# Patient Record
Sex: Female | Born: 2000 | Race: White | Hispanic: No | Marital: Single | Smoking: Never smoker
Health system: Southern US, Community
[De-identification: ages and names within clinical notes are randomized; demographics above are authoritative.]

## PROBLEM LIST (undated history)

## (undated) DIAGNOSIS — J45909 Unspecified asthma, uncomplicated: Secondary | ICD-10-CM

## (undated) DIAGNOSIS — K219 Gastro-esophageal reflux disease without esophagitis: Secondary | ICD-10-CM

## (undated) DIAGNOSIS — R519 Headache, unspecified: Secondary | ICD-10-CM

## (undated) DIAGNOSIS — F419 Anxiety disorder, unspecified: Secondary | ICD-10-CM

## (undated) DIAGNOSIS — R51 Headache: Secondary | ICD-10-CM

## (undated) HISTORY — DX: Anxiety disorder, unspecified: F41.9

## (undated) HISTORY — DX: Headache, unspecified: R51.9

## (undated) HISTORY — DX: Unspecified asthma, uncomplicated: J45.909

## (undated) HISTORY — DX: Headache: R51

## (undated) HISTORY — DX: Gastro-esophageal reflux disease without esophagitis: K21.9

---
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
## 2020-04-09 ENCOUNTER — Telehealth: Payer: Self-pay | Admitting: Pediatrics

## 2020-04-09 DIAGNOSIS — J4520 Mild intermittent asthma, uncomplicated: Secondary | ICD-10-CM

## 2020-04-09 MED ORDER — ALBUTEROL SULFATE HFA 108 (90 BASE) MCG/ACT IN AERS: 2.0000 | INHALATION_SPRAY | RESPIRATORY_TRACT | 1 refills | Status: DC | PRN

## 2020-04-09 NOTE — Telephone Encounter (Signed)
Medication refill sent to pharmacy  

## 2020-04-09 NOTE — Telephone Encounter (Signed)
Mom called, she needs a refill on child's albuterol inhaler sent to [facility]

## 2020-04-13 ENCOUNTER — Other Ambulatory Visit: Payer: Self-pay | Admitting: Pediatrics

## 2020-04-13 DIAGNOSIS — J4520 Mild intermittent asthma, uncomplicated: Secondary | ICD-10-CM

## 2020-04-14 ENCOUNTER — Telehealth: Payer: Self-pay | Admitting: Pediatrics

## 2020-04-14 DIAGNOSIS — J4520 Mild intermittent asthma, uncomplicated: Secondary | ICD-10-CM

## 2020-04-14 MED ORDER — ALBUTEROL SULFATE HFA 108 (90 BASE) MCG/ACT IN AERS: 2.0000 | INHALATION_SPRAY | RESPIRATORY_TRACT | 5 refills | Status: DC | PRN

## 2020-04-14 NOTE — Telephone Encounter (Signed)
Grandma called, she said the inhaler that was sent to the pharmacy is not covered by medicaid. She would like the pro-air sent instead.

## 2020-04-14 NOTE — Telephone Encounter (Signed)
Sent to [facility] pharmacy

## 2020-05-07 ENCOUNTER — Encounter: Payer: Self-pay | Admitting: Pediatrics

## 2020-05-07 ENCOUNTER — Other Ambulatory Visit: Payer: Self-pay

## 2020-05-07 ENCOUNTER — Ambulatory Visit (INDEPENDENT_AMBULATORY_CARE_PROVIDER_SITE_OTHER): Payer: Medicaid Other | Admitting: Pediatrics

## 2020-05-07 VITALS — BP 119/80 | HR 92 | Ht 63.5 in | Wt 190.2 lb

## 2020-05-07 DIAGNOSIS — K2100 Gastro-esophageal reflux disease with esophagitis, without bleeding: Secondary | ICD-10-CM | POA: Diagnosis not present

## 2020-05-07 DIAGNOSIS — E559 Vitamin D deficiency, unspecified: Secondary | ICD-10-CM

## 2020-05-07 DIAGNOSIS — R1319 Other dysphagia: Secondary | ICD-10-CM

## 2020-05-07 DIAGNOSIS — E781 Pure hyperglyceridemia: Secondary | ICD-10-CM | POA: Diagnosis not present

## 2020-05-07 NOTE — Progress Notes (Signed)
Patient was not accompanied. Interpreter:  none  SUBJECTIVE:  HPI: Veronica Crane is a 19 y.o. with mid sternal chest pain for 1 week.  When it started, it was just one big thump. She also felt a little faint. She walked to the bathroom to calm herself down, then she sat down.  She was not doing anything when it happened; she was just sitting.     Since then she has been feeling a squeezing feeling in her mid chest.  No palpitations. She does not have any problems breathing.  It is difficult to swallow, like she can really feel the bolus move down. Then she feels like the bolus gets stuck and she has to re-swallow.  When she drinks fluids, she has "loud gulps" which is a change.  She has a history of reflux.  She was started on Pantoprazole by Dr Janit Bern. The heartburn had gotten better, but now it seems to have come back. She is still taking Pantoprazole.    Also, she states that she has had RLQ pain intermittently.  She has seen Dr Darol Destine who had planned on doing an MRI which was not covered by insurance. Review of records reveal that he was entertaining Irritable Bowel Syndrome as a diagnosis.  She had sharp sensation radiating down her left arm yesterday at 1 am. She was already awake. She was laying on her left side.   When she lays down the feeling gets worse.           She breathes fast and hard after going up a flight of steps, but denies trouble getting her breath in or out.  Her asthma usually is triggered with exercise.  She was recently diagnosed with Vitamin D Deficiency.  She has been worried about that diagnosis.  She does take 5000 IU Vitamin D every day since February.     Review of Systems  Constitutional: Negative for activity change, appetite change, diaphoresis, fatigue and unexpected weight change.  HENT: Negative for drooling, facial swelling and mouth sores.   Eyes: Negative for visual disturbance.  Respiratory: Negative for cough and stridor.   Cardiovascular: Negative  for leg swelling.  Gastrointestinal: Negative for blood in stool.  Genitourinary: Negative for menstrual problem and pelvic pain.  Musculoskeletal: Negative for back pain, gait problem, neck pain and neck stiffness.  Skin: Negative for color change and rash.  Neurological: Negative for tremors and weakness.  Psychiatric/Behavioral: Negative for agitation, behavioral problems, self-injury and sleep disturbance.     Past Medical History:  Diagnosis Date  . Anxiety   . Asthma   . GERD (gastroesophageal reflux disease)   . Headache     No Known Allergies Outpatient Medications Prior to Visit  Medication Sig Dispense Refill  . albuterol (PROAIR HFA) 108 (90 Base) MCG/ACT inhaler Inhale 2 puffs into the lungs every 4 (four) hours as needed for wheezing or shortness of breath. 18 g 5  . Multiple Vitamin (MULTIVITAMIN WITH MINERALS) TABS tablet Take 1 tablet by mouth daily.    . pantoprazole (PROTONIX) 20 MG tablet Take 1 tablet (20 mg total) by mouth daily. 30 tablet 11  . albuterol (VENTOLIN HFA) 108 (90 Base) MCG/ACT inhaler INHALE 2 PUFFS BY MOUTH EVERY 4 HOURS AS NEEDED FOR WHEEZING FOR SHORTNESS OF BREATH 18 g 1   No facility-administered medications prior to visit.         OBJECTIVE: VITALS: BP 119/80   Pulse 92   Ht 5' 3.5" (1.613 m)  Wt 190 lb 3.2 oz (86.3 kg)   SpO2 97%   BMI 33.16 kg/m   Wt Readings from Last 3 Encounters:  05/07/20 190 lb 3.2 oz (86.3 kg) (97 %, Z= 1.81)*  10/29/19 175 lb 12.8 oz (79.7 kg) (94 %, Z= 1.59)*  08/13/19 176 lb (79.8 kg) (95 %, Z= 1.60)*   * Growth percentiles are based on CDC (Girls, 2-20 Years) data.     EXAM: General:  alert in no acute distress   Eyes: anicteric. PERRL. EOMI Mouth: nonerythematous tonsillar pillars, normal posterior pharyngeal wall, tongue midline, palate normal, no lesions, no bulging Neck:  supple. No thyromegaly. No cervical lymphadenopathy. Heart:  regular rate & rhythm.  No murmurs Lungs:  good air entry  bilaterally.  No adventitious sounds Abdomen: soft, non-distended, non-tender, no hepatosplenomegaly normal bowel sounds Skin: no rash Neurological: Non-focal. No facial asymmetry Extremities:  no clubbing/cyanosis/edema   ASSESSMENT/PLAN: 1. Other dysphagia I suspect the initial "thump" that she felt is a big air bubble.  Since then, she has been very conscious of her esophageal peristalsis over that area.  We will investigate it with an upper GI to make sure that there is no widening or narrowing in that area, especially with long-standing history of reflux.    - DG UGI W SINGLE CM (SOL OR THIN BA); Future - Ambulatory referral to Gastroenterology  Her exam and history are not consistent with a cardiac etiology.  2. Gastroesophageal reflux disease with esophagitis without hemorrhage - Ambulatory referral to Gastroenterology  She will also talk to him about the intermittent RLQ pain that she is experiencing.  With her vague symptoms, I wonder if she could have Crohn's disease.  She did have a normal ESR and CBC in the past.  Also, she stopped taking the Elavil because of how it made her feel. I told her that it is better that she let the prescriber know (I.e. Dr Darol Destine) so that he could make adjustments or changes.    3. Vitamin D deficiency Will check a level.  For now continue taking Vit D. - VITAMIN D 25 Hydroxy (Vit-D Deficiency, Fractures)  4. Pure hypertriglyceridemia Reassured her that her initial elevation is only mild.  Nonetheless, it is good that she is concerned as that means she will continue to be compliant with lifestyle changes.   - Lipid panel - Hemoglobin A1c     Return for phone call with results of tests within 1 week.

## 2020-05-07 NOTE — Patient Instructions (Signed)
High Triglycerides Eating Plan Triglycerides are a type of fat in the blood. High levels of triglycerides can increase your risk of heart disease and stroke. If your triglyceride levels are high, choosing the right foods can help lower your triglycerides and keep your heart healthy. Work with your health care provider or a diet and nutrition specialist (dietitian) to develop an eating plan that is right for you. What are tips for following this plan? General guidelines   Lose weight, if you are overweight. For most people, losing 5-10 lbs (2-5 kg) helps lower triglyceride levels. A weight-loss plan may include. ? 30 minutes of exercise at least 5 days a week. ? Reducing the amount of calories, sugar, and fat you eat.  Eat a wide variety of fresh fruits, vegetables, and whole grains. These foods are high in fiber.  Eat foods that contain healthy fats, such as fatty fish, nuts, seeds, and olive oil.  Avoid foods that are high in added sugar, added salt (sodium), saturated fat, and trans fat.  Avoid low-fiber, refined carbohydrates such as white bread, crackers, noodles, and white rice.  Avoid foods with partially hydrogenated oils (trans fats), such as fried foods or stick margarine.  Limit alcohol intake to no more than 1 drink a day for nonpregnant women and 2 drinks a day for men. One drink equals 12 oz of beer, 5 oz of wine, or 1 oz of hard liquor. Your health care provider may recommend that you drink less depending on your overall health. Reading food labels  Check food labels for the amount of saturated fat. Choose foods with no or very little saturated fat.  Check food labels for the amount of trans fat. Choose foods with no trans fat.  Check food labels for the amount of cholesterol. Choose foods low in cholesterol. Ask your dietitian how much cholesterol you should have each day.  Check food labels for the amount of sodium. Choose foods with less than 140 milligrams (mg) per  serving. Shopping  Buy dairy products labeled as nonfat (skim) or low-fat (1%).  Avoid buying processed or prepackaged foods. These are often high in added sugar, sodium, and fat. Cooking  Choose healthy fats when cooking, such as olive oil or canola oil.  Cook foods using lower fat methods, such as baking, broiling, boiling, or grilling.  Make your own sauces, dressings, and marinades when possible, instead of buying them. Store-bought sauces, dressings, and marinades are often high in sodium and sugar. Meal planning  Eat more home-cooked food and less restaurant, buffet, and fast food.  Eat fatty fish at least 2 times each week. Examples of fatty fish include salmon, trout, mackerel, tuna, and herring.  If you eat whole eggs, do not eat more than 3 egg yolks per week. What foods are recommended? The items listed may not be a complete list. Talk with your dietitian about what dietary choices are best for you. Grains Whole wheat or whole grain breads, crackers, cereals, and pasta. Unsweetened oatmeal. Bulgur. Barley. Quinoa. Brown rice. Whole wheat flour tortillas. Vegetables Fresh or frozen vegetables. Low-sodium canned vegetables. Fruits All fresh, canned (in natural juice), or frozen fruits. Meats and other protein foods Skinless chicken or turkey. Ground chicken or turkey. Lean cuts of pork, trimmed of fat. Fish and seafood, especially salmon, trout, and herring. Egg whites. Dried beans, peas, or lentils. Unsalted nuts or seeds. Unsalted canned beans. Natural peanut or almond butter. Dairy Low-fat dairy products. Skim or low-fat (1%) milk. Reduced fat (  2%) and low-sodium cheese. Low-fat ricotta cheese. Low-fat cottage cheese. Plain, low-fat yogurt. Fats and oils Tub margarine without trans fats. Light or reduced-fat mayonnaise. Light or reduced-fat salad dressings. Avocado. Safflower, olive, sunflower, soybean, and canola oils. What foods are not recommended? The items listed  may not be a complete list. Talk with your dietitian about what dietary choices are best for you. Grains White bread. White (regular) pasta. White rice. Cornbread. Bagels. Pastries. Crackers that contain trans fat. Vegetables Creamed or fried vegetables. Vegetables in a cheese sauce. Fruits Sweetened dried fruit. Canned fruit in syrup. Fruit juice. Meats and other protein foods Fatty cuts of meat. Ribs. Chicken wings. Bacon. Sausage. Bologna. Salami. Chitterlings. Fatback. Hot dogs. Bratwurst. Packaged lunch meats. Dairy Whole or reduced-fat (2%) milk. Half-and-half. Cream cheese. Full-fat or sweetened yogurt. Full-fat cheese. Nondairy creamers. Whipped toppings. Processed cheese or cheese spreads. Cheese curds. Beverages Alcohol. Sweetened drinks, such as soda, lemonade, fruit drinks, or punches. Fats and oils Butter. Stick margarine. Lard. Shortening. Ghee. Bacon fat. Tropical oils, such as coconut, palm kernel, or palm oils. Sweets and desserts Corn syrup. Sugars. Honey. Molasses. Candy. Jam and jelly. Syrup. Sweetened cereals. Cookies. Pies. Cakes. Donuts. Muffins. Ice cream. Condiments Store-bought sauces, dressings, and marinades that are high in sugar, such as ketchup and barbecue sauce. Summary  High levels of triglycerides can increase the risk of heart disease and stroke. Choosing the right foods can help lower your triglycerides.  Eat plenty of fresh fruits, vegetables, and whole grains. Choose low-fat dairy and lean meats. Eat fatty fish at least twice a week.  Avoid processed and prepackaged foods with added sugar, sodium, saturated fat, and trans fat.  If you need suggestions or have questions about what types of food are good for you, talk with your health care provider or a dietitian. This information is not intended to replace advice given to you by your health care provider. Make sure you discuss any questions you have with your health care provider. Document Revised:  08/26/2017 Document Reviewed: 11/16/2016 Elsevier Patient Education  2020 [employer] Inc.  

## 2020-05-16 ENCOUNTER — Encounter: Payer: Self-pay | Admitting: Internal Medicine

## 2020-06-06 ENCOUNTER — Telehealth: Payer: Self-pay | Admitting: Pediatrics

## 2020-06-06 NOTE — Telephone Encounter (Signed)
I called the patient to see if anyone from [facility] has scheduled her UGI and she said no. Could you arrange for this? Thanks!!

## 2020-06-09 NOTE — Telephone Encounter (Signed)
UGI has been scheduled for 06/12/20, pt to arrive at 11:15 am at [facility]; lvm with appt info on Faatimah's cell

## 2020-06-12 ENCOUNTER — Ambulatory Visit (HOSPITAL_COMMUNITY)
Admission: RE | Admit: 2020-06-12 | Discharge: 2020-06-12 | Disposition: A | Discharge status: Home / Self Care | Payer: Medicaid Other | Source: Ambulatory Visit | Attending: Pediatrics | Admitting: Pediatrics

## 2020-06-12 ENCOUNTER — Other Ambulatory Visit: Payer: Self-pay | Admitting: Pediatrics

## 2020-06-12 ENCOUNTER — Other Ambulatory Visit: Payer: Self-pay

## 2020-06-12 DIAGNOSIS — R1319 Other dysphagia: Secondary | ICD-10-CM

## 2020-06-13 NOTE — Progress Notes (Signed)
Please tell Elisabel that her test showed reflux, no other abnormality. She can take a TUMS as needed.  She can get that over the counter.

## 2020-07-03 NOTE — Progress Notes (Signed)
Referring Provider: Johny Drilling, DO Primary Care Physician:  Vella Kohler, MD Primary Gastroenterologist:  Dr. Jena Gauss  Chief Complaint  Patient presents with  . Dysphagia    x 6-8 months  . Gastroesophageal Reflux    burning in chest/throat. Tried pantoprazole and did not help    HPI:   [facility]onica Crane is a 19 y.o. female presenting today at the request of Johny Drilling, DO for GERD and dysphagia.  Reviewed office visit 05/07/2020 with Dr. Mort Sawyers.  Patient was complaining of midsternal chest pain x1 week.  Reported difficulty swallowing, like she can feel the bolus move down and occasionally felt the bolus get stuck requiring reswallowing.  Has history of reflux and was taking pantoprazole.  Also experienced sharp sensation radiating down her left arm at 1 AM the day prior to office visit.  Stated when laying down the feeling gets worse.  Also reported intermittent RLQ abdominal pain.  Abdominal exam was benign.  Recommended upper GI series and referral to GI.  UGI 06/12/2020 with GERD, otherwise normal.  Today:   GERD: 2+ year history of GERD. Burning in her chest/throat daily. Occasional nausea. No vomiting. Tried Protonix 20mg  helped for about 1 year but started to wear off and stopped about 1 month ago. When laying down, reflux symptom are the worst. Will take tums at night. Eating fast foods 2-3 times a week. No soda. No carbonated beverages. Occasionally eats within 3 hours of laying down at night.   Ibuprofen for menstrual cramping a couple times a month.  Dysphagia: Present for 6-8 months. Feels like the foods get stuck. Requires a second swallow and foods go down.  Occasional food regurgitation.   No brbpr or melena.  BMs typically daily. Occasional constipation. Had some mucous in her stools and had some diarrhea when she was on Protonix. This has essentially resolved.  No abdominal pain.  Reports she had experienced lower abdominal pain previously, but this seemed  to resolve itself.   Past Medical History:  Diagnosis Date  . Anxiety   . Asthma   . GERD (gastroesophageal reflux disease)   . Headache     Past Surgical History:  Procedure Laterality Date  . SCALP LACERATION REPAIR N/A 03/02/2017   Procedure: CLOSURE SCALP LACERATION;  Surgeon: 05/02/2017, MD;  Location: [facility] OR;  Service: General;  Laterality: N/A;  . wisdom teeth removal  2021    Current Outpatient Medications  Medication Sig Dispense Refill  . albuterol (PROAIR HFA) 108 (90 Base) MCG/ACT inhaler Inhale 2 puffs into the lungs every 4 (four) hours as needed for wheezing or shortness of breath. 18 g 5  . cholecalciferol (VITAMIN D3) 25 MCG (1000 UNIT) tablet Take 1,000 Units by mouth daily.    . Multiple Vitamin (MULTIVITAMIN WITH MINERALS) TABS tablet Take 1 tablet by mouth daily.    . SUMAtriptan (IMITREX) 50 MG tablet Take 50 mg at onset of migraine. May repeat in 1 hour if not gone.    2022 esomeprazole (NEXIUM) 40 MG capsule Take 1 capsule (40 mg total) by mouth daily before breakfast. 30 capsule 5   No current facility-administered medications for this visit.    Allergies as of 07/04/2020  . (No Known Allergies)    Family History  Problem Relation Age of Onset  . Asthma Mother   . Asthma Maternal Grandmother   . Immunodeficiency Maternal Grandfather   . Immunodeficiency Paternal Grandfather   . Irritable bowel syndrome Neg Hx   .  Crohn's disease Neg Hx   . Celiac disease Neg Hx   . Colon cancer Neg Hx     Social History   Socioeconomic History  . Marital status: Single    Spouse name: Not on file  . Number of children: Not on file  . Years of education: Not on file  . Highest education level: Not on file  Occupational History  . Not on file  Tobacco Use  . Smoking status: Never Smoker  . Smokeless tobacco: Never Used  Substance and Sexual Activity  . Alcohol use: Never  . Drug use: Never  . Sexual activity: Yes    Birth control/protection: None    Other Topics Concern  . Not on file  Social History Narrative      Lives at    She lives with her mom only. She has one brother.   She enjoys working out, listening to music, and shopping.   Social Determinants of Health   Financial Resource Strain:   . Difficulty of Paying Living Expenses: Not on file  Food Insecurity:   . Worried About Programme researcher, broadcasting/film/video in the Last Year: Not on file  . Ran Out of Food in the Last Year: Not on file  Transportation Needs:   . Lack of Transportation (Medical): Not on file  . Lack of Transportation (Non-Medical): Not on file  Physical Activity:   . Days of Exercise per Week: Not on file  . Minutes of Exercise per Session: Not on file  Stress:   . Feeling of Stress : Not on file  Social Connections:   . Frequency of Communication with Friends and Family: Not on file  . Frequency of Social Gatherings with Friends and Family: Not on file  . Attends Religious Services: Not on file  . Active Member of Clubs or Organizations: Not on file  . Attends Banker Meetings: Not on file  . Marital Status: Not on file  Intimate Partner Violence:   . Fear of Current or Ex-Partner: Not on file  . Emotionally Abused: Not on file  . Physically Abused: Not on file  . Sexually Abused: Not on file    Review of Systems: Gen: Denies any fever, chills, cold or flulike symptoms, presyncope, syncope.  Admits to occasional lightheadedness. CV: Denies chest pain or heart palpitations. Resp: Denies shortness of breath or cough.  GI: See HPI. GU : Denies urinary burning, urinary frequency, urinary hesitancy MS: Denies joint pain Derm: Denies rash Psych: Admits to anxiety. Heme: See HPI  Physical Exam: BP 120/81   Pulse 72   Temp (!) 97.3 F (36.3 C) (Oral)   Ht 5\' 4"  (1.626 m)   Wt 186 lb 3.2 oz (84.5 kg)   LMP 06/05/2020 (Approximate)   BMI 31.96 kg/m  General:   Alert and oriented. Pleasant and cooperative. Well-nourished and  well-developed.  Head:  Normocephalic and atraumatic. Eyes:  Without icterus, sclera clear and conjunctiva pink.  Ears:  Normal auditory acuity. Lungs:  Clear to auscultation bilaterally. No wheezes, rales, or rhonchi. No distress.  Heart:  S1, S2 present without murmurs appreciated.  Abdomen:  +BS, soft, non-tender and non-distended. No HSM noted. No guarding or rebound. No masses appreciated.  Rectal:  Deferred  Msk:  Symmetrical without gross deformities. Normal posture. Extremities:  Without edema. Neurologic:  Alert and  oriented x4;  grossly normal neurologically. Skin:  Intact without significant lesions or rashes. Psych: Normal mood and affect.

## 2020-07-04 ENCOUNTER — Encounter: Payer: Self-pay | Admitting: Gastroenterology

## 2020-07-04 ENCOUNTER — Ambulatory Visit (INDEPENDENT_AMBULATORY_CARE_PROVIDER_SITE_OTHER): Payer: Medicaid Other | Admitting: Gastroenterology

## 2020-07-04 ENCOUNTER — Telehealth: Payer: Self-pay | Admitting: *Deleted

## 2020-07-04 ENCOUNTER — Other Ambulatory Visit: Payer: Self-pay

## 2020-07-04 DIAGNOSIS — R1310 Dysphagia, unspecified: Secondary | ICD-10-CM

## 2020-07-04 DIAGNOSIS — K219 Gastro-esophageal reflux disease without esophagitis: Secondary | ICD-10-CM

## 2020-07-04 MED ORDER — ESOMEPRAZOLE MAGNESIUM 40 MG PO CPDR: 40.0000 mg | DELAYED_RELEASE_CAPSULE | Freq: Every day | ORAL | 5 refills | Status: DC

## 2020-07-04 NOTE — Telephone Encounter (Signed)
Called pt. She has been scheduled for EGD +/-DIL with propofol with Dr. Jena Gauss, asa 2, on 10/21 at 12:45pm. Aware will need covid test prior. Advised will send her prep instructions and her covid test appt to her [facility]. She voiced understanding.

## 2020-07-04 NOTE — Patient Instructions (Addendum)
We will be scheduled for an upper endoscopy with possible dilation of your esophagus in the near future with Dr. Jena Gauss.  For now, I recommend you eat slowly, take small bites, chew thoroughly, and drink plenty of liquids throughout your meals. All meats should be ground finely. If something were to get hung in your esophagus and not come up or go down, you should proceed to the emergency room.  Please start Nexium 40 mg daily 30 minutes before breakfast.  Please follow a GERD diet/lifestyle: Avoid fried, fatty, greasy, spicy, citrus foods. Avoid caffeine and carbonated beverages. Avoid chocolate. Try eating 4-6 small meals a day rather than 3 large meals. Do not eat within 3 hours of laying down. Prop head of bed up on wood or bricks to create a 6 inch incline.  We will see back after your procedure.  Do not hesitate to call if you have questions or concerns prior.  It was great meeting you today!  Ermalinda Memos, PA-C [facility]   Food Choices for Gastroesophageal Reflux Disease, Adult When you have gastroesophageal reflux disease (GERD), the foods you eat and your eating habits are very important. Choosing the right foods can help ease your discomfort. Think about working with a nutrition specialist (dietitian) to help you make good choices. What are tips for following this plan?  Meals  Choose healthy foods that are low in fat, such as fruits, vegetables, whole grains, low-fat dairy products, and lean meat, fish, and poultry.  Eat small meals often instead of 3 large meals a day. Eat your meals slowly, and in a place where you are relaxed. Avoid bending over or lying down until 2-3 hours after eating.  Avoid eating meals 2-3 hours before bed.  Avoid drinking a lot of liquid with meals.  Cook foods using methods other than frying. Bake, grill, or broil food instead.  Avoid or limit: ? Chocolate. ? Peppermint or spearmint. ? Alcohol. ? Pepper. ? Black  and decaffeinated coffee. ? Black and decaffeinated tea. ? Bubbly (carbonated) soft drinks. ? Caffeinated energy drinks and soft drinks.  Limit high-fat foods such as: ? Fatty meat or fried foods. ? Whole milk, cream, butter, or ice cream. ? Nuts and nut butters. ? Pastries, donuts, and sweets made with butter or shortening.  Avoid foods that cause symptoms. These foods may be different for everyone. Common foods that cause symptoms include: ? Tomatoes. ? Oranges, lemons, and limes. ? Peppers. ? Spicy food. ? Onions and garlic. ? Vinegar. Lifestyle  Maintain a healthy weight. Ask your doctor what weight is healthy for you. If you need to lose weight, work with your doctor to do so safely.  Exercise for at least 30 minutes for 5 or more days each week, or as told by your doctor.  Wear loose-fitting clothes.  Do not smoke. If you need help quitting, ask your doctor.  Sleep with the head of your bed higher than your feet. Use a wedge under the mattress or blocks under the bed frame to raise the head of the bed. Summary  When you have gastroesophageal reflux disease (GERD), food and lifestyle choices are very important in easing your symptoms.  Eat small meals often instead of 3 large meals a day. Eat your meals slowly, and in a place where you are relaxed.  Limit high-fat foods such as fatty meat or fried foods.  Avoid bending over or lying down until 2-3 hours after eating.  Avoid peppermint and spearmint, caffeine, alcohol,  and chocolate. This information is not intended to replace advice given to you by your health care provider. Make sure you discuss any questions you have with your health care provider. Document Revised: 01/04/2019 Document Reviewed: 10/19/2016 Elsevier Patient Education  2020 [employer].

## 2020-07-04 NOTE — Assessment & Plan Note (Signed)
19 year old female with 2+ year history of GERD.  Not currently on a PPI.  Had been on Protonix for about 1 year which began to lose effect so she stopped the medication about 1 month ago.  Also reports Protonix caused diarrhea which has now resolved.  Currently with reflux symptoms daily that are worse at night.  Also reporting 6-8 months of dysphagia.  Suspect GERD symptoms are likely influenced by dietary habits.  Plan: Start Nexium 40 mg daily 30 minutes before breakfast. Counseled on the importance of dietary changes and following a "GERD diet/lifestyle".  Information was provided on this. Proceed with EGD +/- dilation with propofol with Dr. Jena Gauss in the near future. The risks, benefits, and alternatives have been discussed with the patient in detail. The patient states understanding and desires to proceed.  We will plan to see her back in the office after procedure.  Hopefully we will be able to wean down PPI in the future.

## 2020-07-04 NOTE — Assessment & Plan Note (Addendum)
6-8 months of dysphagia with sensation of foods getting stuck in her esophagus requiring a "second swallow".  Occasional food regurgitation.  Also with 2+ year history of GERD that is not adequately controlled as discussed above.  Suspect uncontrolled GERD has likely caused some esophagitis and possible esophageal web, ring, or stricture.  Plan: Proceed with EGD +/-dilation with propofol with Dr. Jena Gauss in the near future. The risks, benefits, and alternatives have been discussed with the patient in detail. The patient states understanding and desires to proceed.  ASA II Eat slowly, take small bites, chew thoroughly, and drink plenty of liquids throughout meals. All meats should be ground finely. Advised if something were to get hung in her esophagus and not come up or go down, she should proceed to the emergency room.  Start Nexium 40 mg daily 30 minutes before breakfast. Follow-up after procedure.

## 2020-07-16 ENCOUNTER — Other Ambulatory Visit: Payer: Self-pay

## 2020-07-16 ENCOUNTER — Other Ambulatory Visit (HOSPITAL_COMMUNITY)
Admission: RE | Admit: 2020-07-16 | Discharge: 2020-07-16 | Disposition: A | Discharge status: Home / Self Care | Payer: Medicaid Other | Source: Ambulatory Visit | Attending: Internal Medicine | Admitting: Internal Medicine

## 2020-07-16 ENCOUNTER — Encounter (HOSPITAL_COMMUNITY): Payer: Self-pay | Admitting: Anesthesiology

## 2020-07-16 DIAGNOSIS — Z01812 Encounter for preprocedural laboratory examination: Secondary | ICD-10-CM | POA: Insufficient documentation

## 2020-07-16 DIAGNOSIS — Z20822 Contact with and (suspected) exposure to covid-19: Secondary | ICD-10-CM | POA: Diagnosis not present

## 2020-07-16 NOTE — Progress Notes (Signed)
Had to leave patient a message with her voicemail. Her procedure is tomorrow. I explained to her if she could not come today before 4:00, she would need to call her Dr.'s office and have them reschedule her procedure. Waiting for patient to call back. Spoke with pt. Said she would be here between 3:00 and 3:30. Made registration aware. Nothing further needed.

## 2020-07-17 ENCOUNTER — Encounter (HOSPITAL_COMMUNITY): Admission: RE | Payer: Self-pay | Source: Home / Self Care

## 2020-07-17 ENCOUNTER — Ambulatory Visit (HOSPITAL_COMMUNITY): Admission: RE | Admit: 2020-07-17 | Payer: Medicaid Other | Source: Home / Self Care | Admitting: Internal Medicine

## 2020-07-17 LAB — SARS CORONAVIRUS 2 (TAT 6-24 HRS): SARS Coronavirus 2: NEGATIVE

## 2020-07-17 SURGERY — ESOPHAGOGASTRODUODENOSCOPY (EGD) WITH PROPOFOL
Anesthesia: Monitor Anesthesia Care

## 2020-07-17 NOTE — OR Nursing (Signed)
Called patient to see if she could come in early. Patient stated she wanted to reschedule because she is "feeling under the weather."

## 2020-07-17 NOTE — Telephone Encounter (Signed)
I received a call from Schneider, Consulting civil engineer at  [facility] stating she spoke with the patient and she didn't feel well and would like to cancel her procedure for today.  She will call our office back to reschedule.

## 2020-08-07 ENCOUNTER — Ambulatory Visit (INDEPENDENT_AMBULATORY_CARE_PROVIDER_SITE_OTHER): Payer: Medicaid Other | Admitting: Pediatrics

## 2020-08-07 ENCOUNTER — Encounter: Payer: Self-pay | Admitting: Pediatrics

## 2020-08-07 ENCOUNTER — Other Ambulatory Visit: Payer: Self-pay

## 2020-08-07 VITALS — BP 132/84 | HR 101 | Ht 63.78 in | Wt 182.4 lb

## 2020-08-07 DIAGNOSIS — J029 Acute pharyngitis, unspecified: Secondary | ICD-10-CM

## 2020-08-07 DIAGNOSIS — J4521 Mild intermittent asthma with (acute) exacerbation: Secondary | ICD-10-CM

## 2020-08-07 DIAGNOSIS — R059 Cough, unspecified: Secondary | ICD-10-CM

## 2020-08-07 DIAGNOSIS — J069 Acute upper respiratory infection, unspecified: Secondary | ICD-10-CM | POA: Diagnosis not present

## 2020-08-07 DIAGNOSIS — Z20822 Contact with and (suspected) exposure to covid-19: Secondary | ICD-10-CM

## 2020-08-07 LAB — POC SOFIA SARS ANTIGEN FIA: SARS:: NEGATIVE

## 2020-08-07 LAB — POCT INFLUENZA B: Rapid Influenza B Ag: NEGATIVE

## 2020-08-07 LAB — POCT RAPID STREP A (OFFICE): Rapid Strep A Screen: NEGATIVE

## 2020-08-07 LAB — POCT INFLUENZA A: Rapid Influenza A Ag: NEGATIVE

## 2020-08-07 MED ORDER — ALBUTEROL SULFATE HFA 108 (90 BASE) MCG/ACT IN AERS: 2.0000 | INHALATION_SPRAY | RESPIRATORY_TRACT | 0 refills | Status: AC | PRN

## 2020-08-07 MED ORDER — PREDNISONE 20 MG PO TABS: 20.0000 mg | ORAL_TABLET | Freq: Two times a day (BID) | ORAL | 0 refills | Status: AC

## 2020-08-07 MED ORDER — MASK VORTEX/CHILD/FROG MISC: 1 refills | Status: AC

## 2020-08-07 NOTE — Progress Notes (Signed)
Name: Veronica Crane Age: 19 y.o. Sex: female DOB: [birth year] MRN: [id number] Date of office visit: 08/07/2020  Chief Complaint  Patient presents with  . Cough  . Nasal Congestion    The patient is here by herself    HPI:  This is a 19 y.o. old patient who presents with nasal congestion, cough, and sore throat which started about one week ago.  She states her cough is congested sounding.  She is coughing up green mucus.  The patient has a history of intermittent asthma.  She states she does not cough at night or with exercise when well.  Since she started coughing, she has been using her albuterol every 2-4 hours, however she does not use a spacer with her albuterol. She feels like the albuterol helps her cough.  She has had fever with T-max of 100.4 F. The patient denies vomiting, diarrhea, or abdominal pain.   Past Medical History:  Diagnosis Date  . Anxiety   . Asthma   . GERD (gastroesophageal reflux disease)   . Headache     Past Surgical History:  Procedure Laterality Date  . SCALP LACERATION REPAIR N/A 03/02/2017   Procedure: CLOSURE SCALP LACERATION;  Surgeon: Harriette Bouillon, MD;  Location: [facility] OR;  Service: General;  Laterality: N/A;  . wisdom teeth removal  2021     Family History  Problem Relation Age of Onset  . Asthma Mother   . Asthma Maternal Grandmother   . Immunodeficiency Maternal Grandfather   . Immunodeficiency Paternal Grandfather   . Irritable bowel syndrome Neg Hx   . Crohn's disease Neg Hx   . Celiac disease Neg Hx   . Colon cancer Neg Hx     Outpatient Encounter Medications as of 08/07/2020  Medication Sig  . albuterol (PROAIR HFA) 108 (90 Base) MCG/ACT inhaler Inhale 2 puffs into the lungs every 4 (four) hours as needed (for cough). USE WITH SPACER  . calcium carbonate (TUMS - DOSED IN MG ELEMENTAL CALCIUM) 500 MG chewable tablet Chew 1,500 mg by mouth daily as needed for indigestion or heartburn.  . Cholecalciferol (DIALYVITE VITAMIN D  5000) 125 MCG (5000 UT) capsule Take 5,000 Units by mouth daily.  Marland Kitchen esomeprazole (NEXIUM) 40 MG capsule Take 1 capsule (40 mg total) by mouth daily before breakfast.  . ibuprofen (ADVIL) 200 MG tablet Take 400-600 mg by mouth every 6 (six) hours as needed for headache or moderate pain.  . [DISCONTINUED] albuterol (PROAIR HFA) 108 (90 Base) MCG/ACT inhaler Inhale 2 puffs into the lungs every 4 (four) hours as needed for wheezing or shortness of breath.  . predniSONE (DELTASONE) 20 MG tablet Take 1 tablet (20 mg total) by mouth 2 (two) times daily with a meal for 5 days.  Marland Kitchen Spacer/Aero-Hold Chamber Mask (MASK VORTEX/CHILD/FROG) MISC Use as directed   No facility-administered encounter medications on file as of 08/07/2020.     ALLERGIES:  No Known Allergies   OBJECTIVE:  VITALS: Blood pressure 132/84, pulse (!) 101, height 5' 3.78" (1.62 m), weight 182 lb 6.4 oz (82.7 kg), SpO2 96 %.   Body mass index is 31.53 kg/m.  95 %ile (Z= 1.68) based on CDC (Girls, 2-20 Years) BMI-for-age based on BMI available as of 08/07/2020.  Wt Readings from Last 3 Encounters:  08/07/20 182 lb 6.4 oz (82.7 kg) (95 %, Z= 1.67)*  07/04/20 186 lb 3.2 oz (84.5 kg) (96 %, Z= 1.74)*  05/07/20 190 lb 3.2 oz (86.3 kg) (97 %, Z=  1.81)*   * Growth percentiles are based on CDC (Girls, 2-20 Years) data.   Ht Readings from Last 3 Encounters:  08/07/20 5' 3.78" (1.62 m) (42 %, Z= -0.20)*  07/04/20 5\' 4"  (1.626 m) (46 %, Z= -0.11)*  05/07/20 5' 3.5" (1.613 m) (38 %, Z= -0.30)*   * Growth percentiles are based on CDC (Girls, 2-20 Years) data.     PHYSICAL EXAM:  General: The patient appears awake, alert, and in no acute distress.  Head: Head is atraumatic/normocephalic.  Ears: Right TM is obscured by cerumen. Left TMs is translucent without erythema or bulging.  Eyes: No scleral icterus.  No conjunctival injection.  Nose: Nasal congestion is present with white nasal discharge.  Turbinates are  injected.  Mouth/Throat: Mouth is moist.  Throat with mild but diffuse erythema.  Neck: Supple without adenopathy.  Chest: Good expansion, symmetric, no deformities noted.  Heart: Regular rate with normal S1-S2.  Lungs: Clear to auscultation bilaterally without wheezes or crackles.  No wheezes with forced expiratory maneuver.  No respiratory distress, work of breathing, or tachypnea noted.  Abdomen: Soft, nontender, nondistended with normal active bowel sounds.   No masses palpated.  No organomegaly noted.  Skin: No rashes noted.  Extremities/Back: Full range of motion with no deficits noted.  Neurologic exam: Musculoskeletal exam appropriate for age, normal strength, and tone.   IN-HOUSE LABORATORY RESULTS: Results for orders placed or performed in visit on 08/07/20  POC SOFIA Antigen FIA  Result Value Ref Range   SARS: Negative Negative  POCT Influenza A  Result Value Ref Range   Rapid Influenza A Ag negative   POCT Influenza B  Result Value Ref Range   Rapid Influenza B Ag negative   POCT rapid strep A  Result Value Ref Range   Rapid Strep A Screen Negative Negative     ASSESSMENT/PLAN:  1. Intermittent asthma with acute exacerbation, unspecified asthma severity This patient has chronic asthma.  Based on patient's intermittent symptoms and lack of persistent symptoms, no persistent medication is necessary for this child at this time.  Albuterol may be given every 4 hours as needed for cough.  If the child requires albuterol more frequently than every 4 hours, the patient should be reexamined. Discussed about the critical importance of use of a spacer with any metered-dose inhaler.  A picture of radio-labeled albuterol was shown to the family with and without a spacer showing the importance of the medicine being delivered appropriately in the lungs with a spacer, and more diffusely located in the mouth, throat, esophagus, and stomach when a spacer is not used.  Use of a  spacer allows the medicine to go where it is supposed to go resulting in increased effectiveness of the medication.  Furthermore, it also prevents the medication from going where it is not supposed to go, thereby decreasing potential side effects.  Because this patient is having an asthma exacerbation today, oral steroid will be prescribed.  - predniSONE (DELTASONE) 20 MG tablet; Take 1 tablet (20 mg total) by mouth 2 (two) times daily with a meal for 5 days.  Dispense: 10 tablet; Refill: 0 - albuterol (PROAIR HFA) 108 (90 Base) MCG/ACT inhaler; Inhale 2 puffs into the lungs every 4 (four) hours as needed (for cough). USE WITH SPACER  Dispense: 36 g; Refill: 0 - Spacer/Aero-Hold Chamber Mask (MASK VORTEX/CHILD/FROG) MISC; Use as directed  Dispense: 2 each; Refill: 1  2. Viral pharyngitis Patient has a sore throat caused by a virus.  The patient will be contagious for the next several days. Soft mechanical diet may be instituted. This includes things from dairy including milkshakes, ice cream, and cold milk. Push fluids. Any problems call back or return to office. Tylenol or Motrin may be used as needed for pain or fever per directions on the bottle. Rest is critically important to enhance the healing process and is encouraged by limiting activities.  - POCT rapid strep A  3. Viral upper respiratory infection Discussed this patient has a viral upper respiratory infection.  Nasal saline may be used for congestion and to thin the secretions for easier mobilization of the secretions. A humidifier may be used. Increase the amount of fluids the child is taking in to improve hydration. Tylenol may be used as directed on the bottle. Rest is critically important to enhance the healing process and is encouraged by limiting activities.  - POC SOFIA Antigen FIA - POCT Influenza A - POCT Influenza B  4. Cough Cough is a protective mechanism to clear airway secretions. Do not suppress a productive cough.   Increasing fluid intake will help keep the patient hydrated, therefore making the cough more productive and subsequently helpful. Running a humidifier helps increase water in the environment also making the cough more productive. If the child develops respiratory distress, increased work of breathing, retractions(sucking in the ribs to breathe), or increased respiratory rate, return to the office or ER.  5. Lab test negative for COVID-19 virus Discussed this patient has tested negative for COVID-19.  However, discussed about testing done and the limitations of the testing.  The testing done in this office is a FIA antigen test, not PCR.  The specificity is 100%, but the sensitivity is 95.2%.  Thus, there is no guarantee patient does not have Covid because lab tests can be incorrect.  Patient should be monitored closely and if the symptoms worsen or become severe, medical attention should be sought for the patient to be reevaluated.   Results for orders placed or performed in visit on 08/07/20  POC SOFIA Antigen FIA  Result Value Ref Range   SARS: Negative Negative  POCT Influenza A  Result Value Ref Range   Rapid Influenza A Ag negative   POCT Influenza B  Result Value Ref Range   Rapid Influenza B Ag negative   POCT rapid strep A  Result Value Ref Range   Rapid Strep A Screen Negative Negative      Meds ordered this encounter  Medications  . predniSONE (DELTASONE) 20 MG tablet    Sig: Take 1 tablet (20 mg total) by mouth 2 (two) times daily with a meal for 5 days.    Dispense:  10 tablet    Refill:  0  . albuterol (PROAIR HFA) 108 (90 Base) MCG/ACT inhaler    Sig: Inhale 2 puffs into the lungs every 4 (four) hours as needed (for cough). USE WITH SPACER    Dispense:  36 g    Refill:  0  . Spacer/Aero-Hold Chamber Mask (MASK VORTEX/CHILD/FROG) MISC    Sig: Use as directed    Dispense:  2 each    Refill:  1     Return if symptoms worsen or fail to improve.

## 2020-09-02 ENCOUNTER — Encounter (INDEPENDENT_AMBULATORY_CARE_PROVIDER_SITE_OTHER): Payer: Self-pay | Admitting: Student in an Organized Health Care Education/Training Program

## 2020-09-08 ENCOUNTER — Ambulatory Visit: Payer: Medicaid Other | Admitting: Pediatrics

## 2020-09-15 ENCOUNTER — Inpatient Hospital Stay: Payer: Medicaid Other | Admitting: Pediatrics

## 2020-09-16 ENCOUNTER — Encounter: Payer: Self-pay | Admitting: Pediatrics

## 2020-09-16 ENCOUNTER — Other Ambulatory Visit: Payer: Self-pay

## 2020-09-16 ENCOUNTER — Ambulatory Visit (INDEPENDENT_AMBULATORY_CARE_PROVIDER_SITE_OTHER): Payer: Medicaid Other | Admitting: Pediatrics

## 2020-09-16 VITALS — BP 118/74 | HR 106 | Ht 63.58 in | Wt 185.2 lb

## 2020-09-16 DIAGNOSIS — R7401 Elevation of levels of liver transaminase levels: Secondary | ICD-10-CM | POA: Diagnosis not present

## 2020-09-16 DIAGNOSIS — Z713 Dietary counseling and surveillance: Secondary | ICD-10-CM | POA: Diagnosis not present

## 2020-09-16 DIAGNOSIS — J039 Acute tonsillitis, unspecified: Secondary | ICD-10-CM | POA: Diagnosis not present

## 2020-09-16 DIAGNOSIS — B2700 Gammaherpesviral mononucleosis without complication: Secondary | ICD-10-CM

## 2020-09-16 LAB — POCT RAPID STREP A (OFFICE): Rapid Strep A Screen: NEGATIVE

## 2020-09-16 LAB — POCT INFLUENZA B: Rapid Influenza B Ag: NEGATIVE

## 2020-09-16 LAB — POCT INFLUENZA A: Rapid Influenza A Ag: NEGATIVE

## 2020-09-16 LAB — POC SOFIA SARS ANTIGEN FIA: SARS:: NEGATIVE

## 2020-09-16 MED ORDER — CEPHALEXIN 500 MG PO CAPS: 500.0000 mg | ORAL_CAPSULE | Freq: Two times a day (BID) | ORAL | 0 refills | Status: AC

## 2020-09-16 NOTE — Progress Notes (Signed)
Patient Name:  Veronica Crane Date of Birth:  March 08, 2001 Age:  19 y.o. Date of Visit:  09/16/2020   Accompanied by:  self Interpreter:  none   SUBJECTIVE:  HPI:  This is a 19 y.o. with Follow-up, Sore Throat, Cough, and Nasal Congestion.  Two weeks ago, she developed eye pain whenever she moved her eyes.  She developed fatigue and eye swelling.  She went to the ED where she was diagnosed with bronchitis.  Her COVID and Flu test were negative.  They did a CT scan to check her sinuses which was negative.  CXR was negative.  They gave her an inhaler.  Three days later (Dec 9th) she returned to the hospital due to extreme fatigue.  Blood tests showed ALT and AST were elevated.  She also had very concentrated urine at that time.  She was given an antibiotic Doxycycline.  She developed belly pain, some vomiting, and diarrhea.  She now feels better. She is no longer feel fatigued.  However she noticed that she has enlarged lymph nodes with a sore throat.  No fever.  She also noticed white stuff on her tonsils.     Review of Systems General:  no recent travel. energy level slightly decreased. no fever.  Nutrition:  decreased appetite.  Normal fluid intake Ophthalmology:  no swelling of the eyelids. no drainage from eyes.  ENT/Respiratory:  no hoarseness. No ear pain. no ear drainage.  Cardiology:  no chest pain. No palpitations. No leg swelling. Gastroenterology:  Mild diarrhea, mild vomiting.  Musculoskeletal:  no myalgias Dermatology:  no rash.  Neurology:  no mental status change, no headaches  Past Medical History:  Diagnosis Date  . Anxiety   . Asthma   . GERD (gastroesophageal reflux disease)   . Headache     Outpatient Medications Prior to Visit  Medication Sig Dispense Refill  . albuterol (PROAIR HFA) 108 (90 Base) MCG/ACT inhaler Inhale 2 puffs into the lungs every 4 (four) hours as needed (for cough). USE WITH SPACER 36 g 0  . calcium carbonate (TUMS - DOSED IN MG ELEMENTAL  CALCIUM) 500 MG chewable tablet Chew 1,500 mg by mouth daily as needed for indigestion or heartburn.    . Cholecalciferol (DIALYVITE VITAMIN D 5000) 125 MCG (5000 UT) capsule Take 5,000 Units by mouth daily.    Marland Kitchen Spacer/Aero-Hold Chamber Mask (MASK VORTEX/CHILD/FROG) MISC Use as directed 2 each 1  . esomeprazole (NEXIUM) 40 MG capsule Take 1 capsule (40 mg total) by mouth daily before breakfast. (Patient not taking: Reported on 09/16/2020) 30 capsule 5  . ibuprofen (ADVIL) 200 MG tablet Take 400-600 mg by mouth every 6 (six) hours as needed for headache or moderate pain. (Patient not taking: Reported on 09/16/2020)     No facility-administered medications prior to visit.     No Known Allergies    OBJECTIVE:  VITALS:  BP 118/74   Pulse (!) 106   Ht 5' 3.58" (1.615 m)   Wt 185 lb 3.2 oz (84 kg)   SpO2 98%   BMI 32.21 kg/m    EXAM: General:  alert in no acute distress.    Eyes:  Mildly erythematous conjunctivae.  Ears: Ear canals normal. Tympanic membranes pearly gray  Turbinates: mildly erythematous  Oral cavity: moist mucous membranes. Erythematous exudative tonsils. No lesions. No asymmetry.  Neck:  supple. (+) cervical lymphadenopathy. Small pea size right sided occipital lymph node Heart:  regular rate & rhythm.  No murmurs.  Lungs: good  air entry bilaterally.  No adventitious sounds.  Abdomen:  No hepatosplenomegaly. No masses.  Normoactive polyphonic bowel sounds.  Skin: no rash  Extremities:  no clubbing/cyanosis   IN-HOUSE LABORATORY RESULTS: Results for orders placed or performed in visit on 09/16/20  Culture, Group A Strep   Specimen: Throat   Throat  Result Value Ref Range   Strep A Culture Negative   Comprehensive metabolic panel  Result Value Ref Range   Glucose 84 65 - 99 mg/dL   BUN 8 6 - 20 mg/dL   Creatinine, Ser 7.51 0.57 - 1.00 mg/dL   BUN/Creatinine Ratio 11 9 - 23   Sodium 142 134 - 144 mmol/L   Potassium 4.2 3.5 - 5.2 mmol/L   Chloride 104 96 -  106 mmol/L   CO2 25 20 - 29 mmol/L   Calcium 9.0 8.7 - 10.2 mg/dL   Total Protein 6.8 6.0 - 8.5 g/dL   Albumin 4.3 3.9 - 5.0 g/dL   Globulin, Total 2.5 1.5 - 4.5 g/dL   Albumin/Globulin Ratio 1.7 1.2 - 2.2   Bilirubin Total 0.6 0.0 - 1.2 mg/dL   Alkaline Phosphatase 319 (H) 42 - 106 IU/L   AST 43 (H) 0 - 40 IU/L   ALT 93 (H) 0 - 32 IU/L  CBC with Differential/Platelet  Result Value Ref Range   WBC 12.5 (H) 3.4 - 10.8 x10E3/uL   RBC 3.95 3.77 - 5.28 x10E6/uL   Hemoglobin 12.0 11.1 - 15.9 g/dL   Hematocrit 02.5 85.2 - 46.6 %   MCV 88 79 - 97 fL   MCH 30.4 26.6 - 33.0 pg   MCHC 34.7 31.5 - 35.7 g/dL   RDW 77.8 24.2 - 35.3 %   Platelets 303 150 - 450 x10E3/uL   Neutrophils 35 Not Estab. %   Lymphs 57 Not Estab. %   Monocytes 6 Not Estab. %   Eos 1 Not Estab. %   Basos 1 Not Estab. %   Neutrophils Absolute 4.4 1.4 - 7.0 x10E3/uL   Lymphocytes Absolute 7.1 (H) 0.7 - 3.1 x10E3/uL   Monocytes Absolute 0.8 0.1 - 0.9 x10E3/uL   EOS (ABSOLUTE) 0.1 0.0 - 0.4 x10E3/uL   Basophils Absolute 0.1 0.0 - 0.2 x10E3/uL   Immature Granulocytes 0 Not Estab. %   Immature Grans (Abs) 0.0 0.0 - 0.1 x10E3/uL   Hematology Comments: Note:   CMV abs, IgG+IgM (cytomegalovirus)  Result Value Ref Range   CMV Ab - IgG <0.60 0.00 - 0.59 U/mL   CMV IgM Ser EIA-aCnc <30.0 0.0 - 29.9 AU/mL  Hemoglobin A1c  Result Value Ref Range   Hgb A1c MFr Bld 5.4 4.8 - 5.6 %   Est. average glucose Bld gHb Est-mCnc 108 mg/dL  Insulin, Free and Total  Result Value Ref Range   Free Insulin 6.4 uU/mL   Total Insulin 6.4 uU/mL  EPSTEIN-BARR VIRUS (EBV) Antibody Profile  Result Value Ref Range   EBV VCA IgM >160.0 (H) 0.0 - 35.9 U/mL   EBV VCA IgG 85.4 (H) 0.0 - 17.9 U/mL   EBV NA IgG <18.0 0.0 - 17.9 U/mL   Interpretation: Comment   POC SOFIA Antigen FIA  Result Value Ref Range   SARS: Negative Negative  POCT Influenza A  Result Value Ref Range   Rapid Influenza A Ag neg   POCT Influenza B  Result Value Ref  Range   Rapid Influenza B Ag neg   POCT rapid strep A  Result Value Ref Range  Rapid Strep A Screen Negative Negative    ASSESSMENT/PLAN: 1. Acute tonsillitis, unspecified etiology Discussed Mono as the primary cause of her tonsillitis and hepatitis.   - Upper Respiratory Culture, Routine - cephALEXin (KEFLEX) 500 MG capsule; Take 1 capsule (500 mg total) by mouth 2 (two) times daily.  Dispense: 10 capsule; Refill: 0 - EPSTEIN-BARR VIRUS (EBV) Antibody Profile - Culture, Group A Strep  2. Gammaherpesviral mononucleosis without complication At this time there is no splenomegaly.  Discussed the complications of splenomegaly and the potential for development of splenomegaly.  Discussed the typical course of Mononucleosis.  Will evaluate for that with bloodwork.   - POC SOFIA Antigen FIA - POCT Influenza A - POCT Influenza B  3. Elevated liver transaminase level Will evaluate for causes of elevated transaminases. - Comprehensive metabolic panel - CBC with Differential/Platelet - CMV abs, IgG+IgM (cytomegalovirus) - Lipid panel  4. Encounter for dietary counseling and surveillance - Lipid panel - Hemoglobin A1c - Insulin, Free and Total    Return if symptoms worsen or fail to improve.

## 2020-09-17 LAB — EPSTEIN-BARR VIRUS (EBV) ANTIBODY PROFILE
EBV NA IgG: <18 U/mL (ref 0.0–17.9)
EBV VCA IgG: 85.4 U/mL — ABNORMAL HIGH (ref 0.0–17.9)
EBV VCA IgM: >160 U/mL — ABNORMAL HIGH (ref 0.0–35.9)

## 2020-09-18 LAB — CULTURE, GROUP A STREP: Strep A Culture: NEGATIVE

## 2020-09-24 LAB — CBC WITH DIFFERENTIAL/PLATELET
Basophils Absolute: 0.1 10*3/uL (ref 0.0–0.2)
Basos: 1 %
EOS (ABSOLUTE): 0.1 10*3/uL (ref 0.0–0.4)
Eos: 1 %
Hematocrit: 34.6 % (ref 34.0–46.6)
Hemoglobin: 12 g/dL (ref 11.1–15.9)
Immature Grans (Abs): 0 10*3/uL (ref 0.0–0.1)
Immature Granulocytes: 0 %
Lymphocytes Absolute: 7.1 10*3/uL — ABNORMAL HIGH (ref 0.7–3.1)
Lymphs: 57 %
MCH: 30.4 pg (ref 26.6–33.0)
MCHC: 34.7 g/dL (ref 31.5–35.7)
MCV: 88 fL (ref 79–97)
Monocytes Absolute: 0.8 10*3/uL (ref 0.1–0.9)
Monocytes: 6 %
Neutrophils Absolute: 4.4 10*3/uL (ref 1.4–7.0)
Neutrophils: 35 %
Platelets: 303 10*3/uL (ref 150–450)
RBC: 3.95 x10E6/uL (ref 3.77–5.28)
RDW: 14.8 % (ref 11.7–15.4)
WBC: 12.5 10*3/uL — ABNORMAL HIGH (ref 3.4–10.8)

## 2020-09-24 LAB — COMPREHENSIVE METABOLIC PANEL
ALT: 93 IU/L — ABNORMAL HIGH (ref 0–32)
AST: 43 IU/L — ABNORMAL HIGH (ref 0–40)
Albumin/Globulin Ratio: 1.7 (ref 1.2–2.2)
Albumin: 4.3 g/dL (ref 3.9–5.0)
Alkaline Phosphatase: 319 IU/L — ABNORMAL HIGH (ref 42–106)
BUN/Creatinine Ratio: 11 (ref 9–23)
BUN: 8 mg/dL (ref 6–20)
Bilirubin Total: 0.6 mg/dL (ref 0.0–1.2)
CO2: 25 mmol/L (ref 20–29)
Calcium: 9 mg/dL (ref 8.7–10.2)
Chloride: 104 mmol/L (ref 96–106)
Creatinine, Ser: 0.72 mg/dL (ref 0.57–1.00)
Globulin, Total: 2.5 g/dL (ref 1.5–4.5)
Glucose: 84 mg/dL (ref 65–99)
Potassium: 4.2 mmol/L (ref 3.5–5.2)
Sodium: 142 mmol/L (ref 134–144)
Total Protein: 6.8 g/dL (ref 6.0–8.5)

## 2020-09-24 LAB — HEMOGLOBIN A1C
Est. average glucose Bld gHb Est-mCnc: 108 mg/dL
Hgb A1c MFr Bld: 5.4 % (ref 4.8–5.6)

## 2020-09-24 LAB — CMV ABS, IGG+IGM (CYTOMEGALOVIRUS)
CMV Ab - IgG: <0.6 U/mL (ref 0.00–0.59)
CMV IgM Ser EIA-aCnc: <30 AU/mL (ref 0.0–29.9)

## 2020-09-24 LAB — INSULIN, FREE AND TOTAL
Free Insulin: 6.4 uU/mL
Total Insulin: 6.4 uU/mL

## 2020-09-30 ENCOUNTER — Encounter: Payer: Self-pay | Admitting: Pediatrics

## 2020-10-07 ENCOUNTER — Ambulatory Visit: Payer: Medicaid Other | Admitting: Pediatrics

## 2020-10-14 ENCOUNTER — Ambulatory Visit: Payer: Medicaid Other | Admitting: Pediatrics

## 2020-10-23 ENCOUNTER — Encounter: Payer: Self-pay | Admitting: Pediatrics

## 2020-10-23 ENCOUNTER — Ambulatory Visit (INDEPENDENT_AMBULATORY_CARE_PROVIDER_SITE_OTHER): Payer: Medicaid Other | Admitting: Pediatrics

## 2020-10-23 ENCOUNTER — Other Ambulatory Visit: Payer: Self-pay

## 2020-10-23 VITALS — BP 120/75 | HR 78 | Ht 63.66 in | Wt 183.0 lb

## 2020-10-23 DIAGNOSIS — K219 Gastro-esophageal reflux disease without esophagitis: Secondary | ICD-10-CM | POA: Diagnosis not present

## 2020-10-23 DIAGNOSIS — R430 Anosmia: Secondary | ICD-10-CM

## 2020-10-23 DIAGNOSIS — R5383 Other fatigue: Secondary | ICD-10-CM

## 2020-10-23 DIAGNOSIS — Z72821 Inadequate sleep hygiene: Secondary | ICD-10-CM

## 2020-10-23 DIAGNOSIS — K5900 Constipation, unspecified: Secondary | ICD-10-CM

## 2020-10-23 DIAGNOSIS — F439 Reaction to severe stress, unspecified: Secondary | ICD-10-CM | POA: Diagnosis not present

## 2020-10-23 DIAGNOSIS — R5381 Other malaise: Secondary | ICD-10-CM | POA: Diagnosis not present

## 2020-10-23 DIAGNOSIS — R7401 Elevation of levels of liver transaminase levels: Secondary | ICD-10-CM

## 2020-10-23 LAB — POC SOFIA SARS ANTIGEN FIA: SARS:: NEGATIVE

## 2020-10-23 MED ORDER — POLYETHYLENE GLYCOL 3350 17 GM/SCOOP PO POWD: ORAL | 1 refills | Status: AC

## 2020-10-23 MED ORDER — ESOMEPRAZOLE MAGNESIUM 40 MG PO CPDR: 40.0000 mg | DELAYED_RELEASE_CAPSULE | Freq: Every day | ORAL | 1 refills | Status: AC

## 2020-10-23 NOTE — Patient Instructions (Addendum)
  NECK MUSCLE TIGHTNESS: Take ibuprofen 400 mg or Tylenol 500 mg.  Then apply an ice pack around your neck and and the base of your neck for 15 minutes.   Then allow your skin to return to normal temperature. Then apply a heating pad around your neck and base of your neck for 20 minutes.  Do this twice a day for 2-3 days.     DEEP BREATHING: Take a slow deep breath over 4 seconds. Hold your breath for up to 8 seconds. (Smile while holding your breath.)  Exhale over 8 seconds.  Do this at least 3 times in a row to help decrease anxiety and palpitations.     EXERCISE: Start with light cardio exercise to help get your muscles moving. Then after 1 week, add some strengthening exercises, such as push ups or 5 lb weights.   Then another week, increase the intensity of your cardio workout, such as adding some blurpies.     REFLUX: Take Nexium daily for 2 weeks (before supper time), then stop.     BRAIN FOG:  Work on jigsaw puzzles and word find games.  Read a book.

## 2020-10-23 NOTE — Progress Notes (Signed)
Patient Name:  Veronica Crane Date of Birth:  March 26, 2001 Age:  20 y.o. Date of Visit:  10/23/2020   Accompanied by:  No one    (primary historian) Interpreter:  none  SUBJECTIVE:  HPI:  Veronica Crane is a 20 y.o. who returns as follow up from her recent Mono illness.  She was first diagnosed about a month ago here in the office.  She then went to the ED a few days later where US showed splenomegaly.  Overall, she feels much better.  She is no longer nauseous nor with abdominal pain.    However, now she complains of brain fog. She feels like she is a little slower to process things. She also complains of fatigue and dyspnea when she goes up a flight of steps. She states that her body feels tired.  She also complains of neck tightness, like someone is holding her neck tight, especially when she lays on her side.    She also complains of chest pain at night when she goes to sleep, located just left of the sternum. She describes it as sharp like someone is pressing on it. No pain when taking a deep breath.  No burning.  No radiation.  There is a weird smell she senses through her nose.   Constipation - small hard pellets daily with abd pain "My resting heart beat is fast".  I feel it mostly at night or when I have pain.   Sleeping is hard because her schedule is messed up (she's been sleeping in.)  Review of Systems  Constitutional: Positive for activity change and fatigue. Negative for chills and fever.  HENT: Positive for trouble swallowing. Negative for congestion, sore throat and voice change.   Eyes: Negative for photophobia.  Respiratory: Negative for cough, choking and chest tightness.   Gastrointestinal: Positive for abdominal pain. Negative for blood in stool and nausea.  Musculoskeletal: Positive for back pain and neck pain. Negative for neck stiffness.  Skin: Negative for pallor and rash.  Neurological: Positive for weakness. Negative for tremors, seizures, light-headedness, numbness  and headaches.  Hematological: Negative for adenopathy. Does not bruise/bleed easily.  Psychiatric/Behavioral: Positive for confusion, decreased concentration and sleep disturbance. Negative for agitation and hallucinations. The patient is nervous/anxious.     Past Medical History:  Diagnosis Date  . Anxiety   . Asthma   . GERD (gastroesophageal reflux disease)   . Headache     Surgeries:   Past Surgical History:  Procedure Laterality Date  . SCALP LACERATION REPAIR N/A 03/02/2017   Procedure: CLOSURE SCALP LACERATION;  Surgeon: Harriette Bouillon, MD;  Location: [facility] OR;  Service: General;  Laterality: N/A;  . wisdom teeth removal  2021     Family History  Problem Relation Age of Onset  . Asthma Mother   . Asthma Maternal Grandmother   . Immunodeficiency Maternal Grandfather   . Immunodeficiency Paternal Grandfather   . Irritable bowel syndrome Neg Hx   . Crohn's disease Neg Hx   . Celiac disease Neg Hx   . Colon cancer Neg Hx    Outpatient Medications Prior to Visit  Medication Sig Dispense Refill  . albuterol (PROAIR HFA) 108 (90 Base) MCG/ACT inhaler Inhale 2 puffs into the lungs every 4 (four) hours as needed (for cough). USE WITH SPACER 36 g 0  . calcium carbonate (TUMS - DOSED IN MG ELEMENTAL CALCIUM) 500 MG chewable tablet Chew 1,500 mg by mouth daily as needed for indigestion or heartburn.    Marland Kitchen  cephALEXin (KEFLEX) 500 MG capsule Take 1 capsule (500 mg total) by mouth 2 (two) times daily. 10 capsule 0  . Cholecalciferol (DIALYVITE VITAMIN D 5000) 125 MCG (5000 UT) capsule Take 5,000 Units by mouth daily.    Marland Kitchen ibuprofen (ADVIL) 200 MG tablet Take 400-600 mg by mouth every 6 (six) hours as needed for headache or moderate pain. (Patient not taking: Reported on 09/16/2020)    . Spacer/Aero-Hold Chamber Mask (MASK VORTEX/CHILD/FROG) MISC Use as directed 2 each 1  . esomeprazole (NEXIUM) 40 MG capsule Take 1 capsule (40 mg total) by mouth daily before breakfast. (Patient not taking:  Reported on 09/16/2020) 30 capsule 5   No facility-administered medications prior to visit.       OBJECTIVE: VITALS:  BP 120/75   Pulse 78   Ht 5' 3.66" (1.617 m)   Wt 183 lb (83 kg)   SpO2 98%   BMI 31.75 kg/m   Body mass index is 31.75 kg/m.    EXAM: General:  alert in no acute distress.   Head:  atraumatic. Normocephalic.  Eyes: erythematous conjunctivae. PERRLA. EOMI. Optic discs sharp.  Ear Canals:  normal.  Tympanic membranes: pearly gray bilaterally. Turbinates:  erythematous            Oral cavity: moist mucous membranes. No lesions, no asymmetry.  Erythematous posterior pharyngeal wall.  Neck:  supple.  No lymphadenpathy. Heart:  regular rate & rhythm.  No murmurs.  Lungs:  good air entry bilaterally.  Clear to auscultation without adventitious sounds. Abd: soft, non-distended, no hepatosplenomegaly Skin: no rash.  Neurological:  Cranial nerves: II-XII intact.  Cerebellar: No dysdiadokinesia. No dysmetria.  Meningismus: Negative Brudzinski.  Negative Kernig.  Proprioception: Negative Romberg.  Negative pronator drift.  Gait: Normal gait cycle. Normal heel to toe.  Motor:  Good tone.  Strength +5/5  Muscle bulk: Normal.  Deep Tendon Reflexes: +2/4.  Sensory: Normal.  Mental Status: Grossly normal.  Mini Mental Exam:   Identifies self, mom, correct year, correct season, correct current location                   3-number memory recall intact                   able to count backwards by 7 from 100                   able to spell WORLD backwards                   able to repeat the phrase "no ifs, ands, or buts"                   able to name 3 objects                   able to copy a sentence from oral dictation                   able to copy image                   able to follow 3 step command Extremities:  no clubbing/cyanosis Back: no CVAT. No deformities.   IN-HOUSE LABORATORY RESULTS: Results for orders placed or performed in visit on 10/23/20  TSH +  free T4  Result Value Ref Range   TSH 3.670 0.450 - 4.500 uIU/mL   Free T4 1.36 0.93 - 1.60 ng/dL  Hepatic function panel  Result  Value Ref Range   Total Protein 7.7 6.0 - 8.5 g/dL   Albumin 4.7 3.9 - 5.0 g/dL   Bilirubin Total 0.5 0.0 - 1.2 mg/dL   Bilirubin, Direct 2.95 0.00 - 0.40 mg/dL   Alkaline Phosphatase 83 42 - 106 IU/L   AST 15 0 - 40 IU/L   ALT 12 0 - 32 IU/L  CBC with Differential/Platelet  Result Value Ref Range   WBC 7.2 3.4 - 10.8 x10E3/uL   RBC 4.34 3.77 - 5.28 x10E6/uL   Hemoglobin 12.5 11.1 - 15.9 g/dL   Hematocrit 18.8 41.6 - 46.6 %   MCV 86 79 - 97 fL   MCH 28.8 26.6 - 33.0 pg   MCHC 33.6 31.5 - 35.7 g/dL   RDW 60.6 30.1 - 60.1 %   Platelets 312 150 - 450 x10E3/uL   Neutrophils 46 Not Estab. %   Lymphs 36 Not Estab. %   Monocytes 11 Not Estab. %   Eos 6 Not Estab. %   Basos 1 Not Estab. %   Neutrophils Absolute 3.4 1.4 - 7.0 x10E3/uL   Lymphocytes Absolute 2.6 0.7 - 3.1 x10E3/uL   Monocytes Absolute 0.8 0.1 - 0.9 x10E3/uL   EOS (ABSOLUTE) 0.4 0.0 - 0.4 x10E3/uL   Basophils Absolute 0.1 0.0 - 0.2 x10E3/uL   Immature Granulocytes 0 Not Estab. %   Immature Grans (Abs) 0.0 0.0 - 0.1 x10E3/uL  POC SOFIA Antigen FIA  Result Value Ref Range   SARS: Negative Negative      ASSESSMENT/PLAN: 1. Physical deconditioning/fatigue 2. Stress  Since she has been pretty inactive due to her recent mono infection, she has become deconditioned.  The treatment is to slowly increase activity over the next 3 weeks.   Exercise will also improve blood flow to the brain and improve her "brain fog".  Her neurological and mini mental exam today is normal.   Stress can cause muscle tension which can make her neck muscles tight and also make her feel tired throughout the day.  Exercise helps to improve overall mental health.  Discussed ways to stretch and relax the neck muscles.  - TSH + free T4 - CBC with Differential/Platelet    3. Gastroesophageal reflux disease,  unspecified whether esophagitis present Nexium is an acid blocker.  Take it for 2 weeks to allow for proper healing of pharyngeal, and esophageal mucosa.  The mainstay of treatment is to avoid its triggers.  Reflux can be aggravated by constipation and stress.   - esomeprazole (NEXIUM) 40 MG capsule; Take 1 capsule (40 mg total) by mouth daily before breakfast.  Dispense: 30 capsule; Refill: 1  4. Constipation, unspecified constipation type Drink 8-10 glasses of fluids daily.   - polyethylene glycol powder (GLYCOLAX/MIRALAX) 17 GM/SCOOP powder; 3 teaspoons mixed in any drink every morning, repeat in the afternoon if needed.  Dispense: 255 g; Refill: 1  5. Poor sleep hygiene Discussed diurnal clock and need for consistent wake-up times and limiting nap times to less than 1 hour.    6. Anosmia I believe the anosmia and the chest discomfort are from the reflux. We obtained a COVID test just to be sure and that was negative oday.   - POC SOFIA Antigen FIA  7. Elevated transaminase level She had elevated LFTs last month from her EBV infection. Will repeat to confirm resolution.  - Hepatic function panel     Return in about 3 weeks (around 11/13/2020) for reck constipation, reflux, fatigue.

## 2020-10-24 LAB — CBC WITH DIFFERENTIAL/PLATELET
Basophils Absolute: 0.1 10*3/uL (ref 0.0–0.2)
Basos: 1 %
EOS (ABSOLUTE): 0.4 10*3/uL (ref 0.0–0.4)
Eos: 6 %
Hematocrit: 37.2 % (ref 34.0–46.6)
Hemoglobin: 12.5 g/dL (ref 11.1–15.9)
Immature Grans (Abs): 0 10*3/uL (ref 0.0–0.1)
Immature Granulocytes: 0 %
Lymphocytes Absolute: 2.6 10*3/uL (ref 0.7–3.1)
Lymphs: 36 %
MCH: 28.8 pg (ref 26.6–33.0)
MCHC: 33.6 g/dL (ref 31.5–35.7)
MCV: 86 fL (ref 79–97)
Monocytes Absolute: 0.8 10*3/uL (ref 0.1–0.9)
Monocytes: 11 %
Neutrophils Absolute: 3.4 10*3/uL (ref 1.4–7.0)
Neutrophils: 46 %
Platelets: 312 10*3/uL (ref 150–450)
RBC: 4.34 x10E6/uL (ref 3.77–5.28)
RDW: 14.1 % (ref 11.7–15.4)
WBC: 7.2 10*3/uL (ref 3.4–10.8)

## 2020-10-24 LAB — HEPATIC FUNCTION PANEL
ALT: 12 IU/L (ref 0–32)
AST: 15 IU/L (ref 0–40)
Albumin: 4.7 g/dL (ref 3.9–5.0)
Alkaline Phosphatase: 83 IU/L (ref 42–106)
Bilirubin Total: 0.5 mg/dL (ref 0.0–1.2)
Bilirubin, Direct: 0.15 mg/dL (ref 0.00–0.40)
Total Protein: 7.7 g/dL (ref 6.0–8.5)

## 2020-10-24 LAB — TSH+FREE T4
Free T4: 1.36 ng/dL (ref 0.93–1.60)
TSH: 3.67 u[IU]/mL (ref 0.450–4.500)

## 2020-10-30 NOTE — Progress Notes (Signed)
Please let Avin know that her liver tests are now all normal.   Also the rest of her blood work (blood counts, thyroid) are all normal.  We will see her soon for her follow up appt.

## 2020-10-30 NOTE — Progress Notes (Signed)
Mom understood

## 2020-11-13 ENCOUNTER — Other Ambulatory Visit: Payer: Self-pay

## 2020-11-13 ENCOUNTER — Encounter: Payer: Self-pay | Admitting: Pediatrics

## 2020-11-13 ENCOUNTER — Ambulatory Visit (INDEPENDENT_AMBULATORY_CARE_PROVIDER_SITE_OTHER): Payer: Medicaid Other | Admitting: Pediatrics

## 2020-11-13 VITALS — BP 123/80 | HR 99 | Ht 63.74 in | Wt 184.4 lb

## 2020-11-13 DIAGNOSIS — B2700 Gammaherpesviral mononucleosis without complication: Secondary | ICD-10-CM | POA: Diagnosis not present

## 2020-11-13 DIAGNOSIS — K5900 Constipation, unspecified: Secondary | ICD-10-CM

## 2020-11-13 DIAGNOSIS — R5381 Other malaise: Secondary | ICD-10-CM | POA: Diagnosis not present

## 2020-11-13 DIAGNOSIS — K2100 Gastro-esophageal reflux disease with esophagitis, without bleeding: Secondary | ICD-10-CM | POA: Diagnosis not present

## 2020-11-13 HISTORY — DX: Gammaherpesviral mononucleosis without complication: B27.00

## 2020-11-13 NOTE — Progress Notes (Signed)
Veronica Crane Name:  Veronica Crane Date of Birth:  2001/09/05 Age:  20 y.o. Date of Visit:  11/13/2020   Accompanied by:  No one  Veronica Crane is the primary historian.) Interpreter:  none  SUBJECTIVE:  HPI: Veronica Crane is here to follow up on Constipation, Gastroesophageal Reflux, and Fatigue.   She now feels much better compared to last visit.  She has started some light exercise. She no longer feels tired all the time. She also no longer has "brain fog".  She is stooling every day and no longer has any chest pain nor heartburn.  She does still feel a little fullness over her cervical lymph nodes, but no neck stiffness. She received the results of her bloodwork performed last time (all normal).             Review of Systems  Constitutional: Negative for activity change, chills and diaphoresis.  HENT: Negative for facial swelling, hearing loss, tinnitus and voice change.   Respiratory: Negative for chest tightness.   Cardiovascular: Negative for chest pain, palpitations and leg swelling.  Gastrointestinal: Negative for abdominal distention, blood in stool and vomiting.  Musculoskeletal: Negative for myalgias and neck pain.  Skin: Negative for rash.  Neurological: Negative for tremors, weakness, light-headedness and headaches.  Psychiatric/Behavioral: Negative for confusion. The Veronica Crane is not nervous/anxious.      Past Medical History:  Diagnosis Date  . Anxiety   . Asthma   . GERD (gastroesophageal reflux disease)   . Headache     No Known Allergies Outpatient Medications Prior to Visit  Medication Sig Dispense Refill  . albuterol (PROAIR HFA) 108 (90 Base) MCG/ACT inhaler Inhale 2 puffs into the lungs every 4 (four) hours as needed (for cough). USE WITH SPACER (Veronica Crane not taking: Reported on 11/13/2020) 36 g 0  . calcium carbonate (TUMS - DOSED IN MG ELEMENTAL CALCIUM) 500 MG chewable tablet Chew 1,500 mg by mouth daily as needed for indigestion or heartburn. (Veronica Crane not taking:  Reported on 11/13/2020)    . cephALEXin (KEFLEX) 500 MG capsule Take 1 capsule (500 mg total) by mouth 2 (two) times daily. (Veronica Crane not taking: Reported on 11/13/2020) 10 capsule 0  . Cholecalciferol (DIALYVITE VITAMIN D 5000) 125 MCG (5000 UT) capsule Take 5,000 Units by mouth daily. (Veronica Crane not taking: Reported on 11/13/2020)    . esomeprazole (NEXIUM) 40 MG capsule Take 1 capsule (40 mg total) by mouth daily before breakfast. (Veronica Crane not taking: Reported on 11/13/2020) 30 capsule 1  . ibuprofen (ADVIL) 200 MG tablet Take 400-600 mg by mouth every 6 (six) hours as needed for headache or moderate pain. (Veronica Crane not taking: No sig reported)    . polyethylene glycol powder (GLYCOLAX/MIRALAX) 17 GM/SCOOP powder 3 teaspoons mixed in any drink every morning, repeat in the afternoon if needed. (Veronica Crane not taking: Reported on 11/13/2020) 255 g 1  . Spacer/Aero-Hold Chamber Mask (MASK VORTEX/CHILD/FROG) MISC Use as directed (Veronica Crane not taking: Reported on 11/13/2020) 2 each 1   No facility-administered medications prior to visit.         OBJECTIVE: VITALS: BP 123/80   Pulse 99   Ht 5' 3.74" (1.619 m)   Wt 184 lb 6.4 oz (83.6 kg)   SpO2 99%   BMI 31.91 kg/m   Wt Readings from Last 3 Encounters:  11/13/20 184 lb 6.4 oz (83.6 kg) (95 %, Z= 1.69)*  10/23/20 183 lb (83 kg) (95 %, Z= 1.67)*  09/16/20 185 lb 3.2 oz (84 kg) (96 %, Z= 1.71)*   *  Growth percentiles are based on CDC (Girls, 2-20 Years) data.     EXAM: General:  Alert.  In no acute distress.  Hydrated.  HEENT:  Head: Atraumatic.  Normocephalic.                 Conjunctivae:  Nonerythematous.                  Oral cavity: moist mucous membranes.  Tongue midline. No lesions.  Neck:  No cervical adenopathy. Supple. Normal ROM. Thyroid is not palpable.  Heart:  Regular rate and rhythm.  No murmurs/gallops/clicks.  Chest: Clear to auscultation with good air entry.  Abdomen: Soft. Non-distended. Normal polyphonic bowel sounds                    No hepatosplenomegaly. Non-tender.  Dermatology:  No rash.  Neurological: Gait: normal.                        Mental Status: grossly normal.      ASSESSMENT/PLAN: 1. Physical deconditioning Resolved. Continue exercise!  2. Gastroesophageal reflux disease with esophagitis without hemorrhage Resolved. She can use Nexium as needed.  3. Constipation, unspecified constipation type Resolved.  She can use Miralax as needed.  4. Gammaherpesviral mononucleosis without complication Resolved.  Residual lymph node enlargement should resolve in another 3 weeks.       Return if symptoms worsen or fail to improve.

## 2021-01-29 ENCOUNTER — Encounter (INDEPENDENT_AMBULATORY_CARE_PROVIDER_SITE_OTHER): Payer: Self-pay
